# Patient Record
Sex: Male | Born: 1985 | Race: White | Hispanic: No | Marital: Married | Smoking: Never smoker
Health system: Southern US, Community
[De-identification: ages and names within clinical notes are randomized; demographics above are authoritative.]

## PROBLEM LIST (undated history)

## (undated) DIAGNOSIS — T7840XA Allergy, unspecified, initial encounter: Secondary | ICD-10-CM

## (undated) DIAGNOSIS — Z8619 Personal history of other infectious and parasitic diseases: Secondary | ICD-10-CM

## (undated) DIAGNOSIS — K219 Gastro-esophageal reflux disease without esophagitis: Secondary | ICD-10-CM

## (undated) HISTORY — PX: TONSILLECTOMY: SUR1361

## (undated) HISTORY — DX: Gastro-esophageal reflux disease without esophagitis: K21.9

## (undated) HISTORY — DX: Personal history of other infectious and parasitic diseases: Z86.19

## (undated) HISTORY — DX: Allergy, unspecified, initial encounter: T78.40XA

---

## 2018-10-16 ENCOUNTER — Encounter: Payer: Self-pay | Admitting: Family Medicine

## 2018-10-16 ENCOUNTER — Ambulatory Visit: Payer: Self-pay | Admitting: Family Medicine

## 2018-10-16 VITALS — BP 120/90 | HR 91 | Temp 98.0°F | Wt 323.0 lb

## 2018-10-16 DIAGNOSIS — I10 Essential (primary) hypertension: Secondary | ICD-10-CM | POA: Insufficient documentation

## 2018-10-16 LAB — CBC
HCT: 45.7 % (ref 39.0–52.0)
Hemoglobin: 15.6 g/dL (ref 13.0–17.0)
MCHC: 34.1 g/dL (ref 30.0–36.0)
MCV: 83.4 fl (ref 78.0–100.0)
PLATELETS: 258 10*3/uL (ref 150.0–400.0)
RBC: 5.48 Mil/uL (ref 4.22–5.81)
RDW: 13.3 % (ref 11.5–15.5)
WBC: 7.7 10*3/uL (ref 4.0–10.5)

## 2018-10-16 LAB — COMPREHENSIVE METABOLIC PANEL
ALT: 48 U/L (ref 0–53)
AST: 25 U/L (ref 0–37)
Albumin: 4.6 g/dL (ref 3.5–5.2)
Alkaline Phosphatase: 80 U/L (ref 39–117)
BUN: 12 mg/dL (ref 6–23)
CALCIUM: 9.7 mg/dL (ref 8.4–10.5)
CO2: 27 mEq/L (ref 19–32)
Chloride: 100 mEq/L (ref 96–112)
Creatinine, Ser: 1 mg/dL (ref 0.40–1.50)
GFR: 91.82 mL/min (ref >60.00)
Glucose, Bld: 90 mg/dL (ref 70–99)
Potassium: 4.5 mEq/L (ref 3.5–5.1)
Sodium: 136 mEq/L (ref 135–145)
Total Bilirubin: 0.9 mg/dL (ref 0.2–1.2)
Total Protein: 7.2 g/dL (ref 6.0–8.3)

## 2018-10-16 LAB — TSH: TSH: 0.95 u[IU]/mL (ref 0.35–4.50)

## 2018-10-16 MED ORDER — LISINOPRIL 10 MG PO TABS: 10.0000 mg | ORAL_TABLET | Freq: Every day | ORAL | 3 refills | Status: DC

## 2018-10-16 NOTE — Progress Notes (Signed)
Carlos LernerJoshua Kussman - 32 y.o. male MRN [id number]  Date of birth: 1986/06/18  Subjective Chief Complaint  Patient presents with  . Hypertension    HPI Carlos Lynn is a 32 y.o. male here today with concern of elevated blood pressure.  He is accompanied by his mother who is a Engineer, civil (consulting)nurse and has been monitoring BP at home.  Reports that readings at home have been 170/100 to as high as 220/120.   Reports that high readings were when he was mad and under stress.  Has a new baby at home as well, denies increased stress relate to this.  He had had some intermittent headache but denies vision changes, anginal symptoms or shortness of breath.  He does admit to a high salt diet.  His activity levels varies each day.    ROS:  A comprehensive ROS was completed and negative except as noted per HPI  No Known Allergies  Past Medical History:  Diagnosis Date  . Allergy   . GERD (gastroesophageal reflux disease)   . History of chickenpox     History reviewed. No pertinent surgical history.  Social History   Socioeconomic History  . Marital status: Married    Spouse name: Not on file  . Number of children: Not on file  . Years of education: Not on file  . Highest education level: Not on file  Occupational History  . Not on file  Social Needs  . Financial resource strain: Not on file  . Food insecurity:    Worry: Not on file    Inability: Not on file  . Transportation needs:    Medical: Not on file    Non-medical: Not on file  Tobacco Use  . Smoking status: Never Smoker  . Smokeless tobacco: Current User    Types: Chew  Substance and Sexual Activity  . Alcohol use: Yes    Alcohol/week: 1.0 - 2.0 standard drinks    Types: 1 - 2 Cans of beer per week    Comment: 1-2 daily  . Drug use: Never  . Sexual activity: Not on file  Lifestyle  . Physical activity:    Days per week: Not on file    Minutes per session: Not on file  . Stress: Not on file  Relationships  . Social connections:   Talks on phone: Not on file    Gets together: Not on file    Attends religious service: Not on file    Active member of club or organization: Not on file    Attends meetings of clubs or organizations: Not on file    Relationship status: Not on file  Other Topics Concern  . Not on file  Social History Narrative  . Not on file    Family History  Problem Relation Age of Onset  . Heart disease Father   . Hypertension Father     Health Maintenance  Topic Date Due  . TETANUS/TDAP  05/31/2005  . HIV Screening  10/17/2019 (Originally 05/31/2001)  . INFLUENZA VACCINE  10/22/2019 (Originally 06/13/2018)    ----------------------------------------------------------------------------------------------------------------------------------------------------------------------------------------------------------------- Physical Exam BP 120/90   Pulse 91   Temp 98 F (36.7 C) (Oral)   Wt (!) 323 lb (146.5 kg)   SpO2 98%   Physical Exam  Constitutional: He is oriented to person, place, and time. He appears well-nourished. No distress.  HENT:  Head: Normocephalic and atraumatic.  Mouth/Throat: Oropharynx is clear and moist.  Eyes: No scleral icterus.  Neck: Neck supple. No thyromegaly present.  Cardiovascular: Normal rate, regular rhythm and normal heart sounds.  Pulmonary/Chest: Effort normal and breath sounds normal.  Lymphadenopathy:    He has no cervical adenopathy.  Neurological: He is alert and oriented to person, place, and time.  Skin: Skin is warm and dry.  Psychiatric: He has a normal mood and affect. His behavior is normal.    ------------------------------------------------------------------------------------------------------------------------------------------------------------------------------------------------------------------- Assessment and Plan  Essential hypertension -BP elevated today but not nearly as high as home readings.  May be related to cuff size at home.    -Start lisinopril 10mg  -Discussed weight loss and reduction of sodium intake  -Labs ordered today.  Return in about 6 weeks (around 11/27/2018) for HTN.

## 2018-10-16 NOTE — Patient Instructions (Addendum)
Nice to meet you Start lisinopril 10mg  daily See me again in about 6 weeks Merry Christmas and Happy New Year!  Hypertension Hypertension is another name for high blood pressure. High blood pressure forces your heart to work harder to pump blood. This can cause problems over time. There are two numbers in a blood pressure reading. There is a top number (systolic) over a bottom number (diastolic). It is best to have a blood pressure below 120/80. Healthy choices can help lower your blood pressure. You may need medicine to help lower your blood pressure if:  Your blood pressure cannot be lowered with healthy choices.  Your blood pressure is higher than 130/80.  Follow these instructions at home: Eating and drinking  If directed, follow the DASH eating plan. This diet includes: ? Filling half of your plate at each meal with fruits and vegetables. ? Filling one quarter of your plate at each meal with whole grains. Whole grains include whole wheat pasta, brown rice, and whole grain bread. ? Eating or drinking low-fat dairy products, such as skim milk or low-fat yogurt. ? Filling one quarter of your plate at each meal with low-fat (lean) proteins. Low-fat proteins include fish, skinless chicken, eggs, beans, and tofu. ? Avoiding fatty meat, cured and processed meat, or chicken with skin. ? Avoiding premade or processed food.  Eat less than 1,500 mg of salt (sodium) a day.  Limit alcohol use to no more than 1 drink a day for nonpregnant women and 2 drinks a day for men. One drink equals 12 oz of beer, 5 oz of wine, or 1 oz of hard liquor. Lifestyle  Work with your doctor to stay at a healthy weight or to lose weight. Ask your doctor what the best weight is for you.  Get at least 30 minutes of exercise that causes your heart to beat faster (aerobic exercise) most days of the week. This may include walking, swimming, or biking.  Get at least 30 minutes of exercise that strengthens your  muscles (resistance exercise) at least 3 days a week. This may include lifting weights or pilates.  Do not use any products that contain nicotine or tobacco. This includes cigarettes and e-cigarettes. If you need help quitting, ask your doctor.  Check your blood pressure at home as told by your doctor.  Keep all follow-up visits as told by your doctor. This is important. Medicines  Take over-the-counter and prescription medicines only as told by your doctor. Follow directions carefully.  Do not skip doses of blood pressure medicine. The medicine does not work as well if you skip doses. Skipping doses also puts you at risk for problems.  Ask your doctor about side effects or reactions to medicines that you should watch for. Contact a doctor if:  You think you are having a reaction to the medicine you are taking.  You have headaches that keep coming back (recurring).  You feel dizzy.  You have swelling in your ankles.  You have trouble with your vision. Get help right away if:  You get a very bad headache.  You start to feel confused.  You feel weak or numb.  You feel faint.  You get very bad pain in your: ? Chest. ? Belly (abdomen).  You throw up (vomit) more than once.  You have trouble breathing. Summary  Hypertension is another name for high blood pressure.  Making healthy choices can help lower blood pressure. If your blood pressure cannot be controlled with healthy  choices, you may need to take medicine. This information is not intended to replace advice given to you by your health care provider. Make sure you discuss any questions you have with your health care provider. Document Released: 04/17/2008 Document Revised: 09/27/2016 Document Reviewed: 09/27/2016 Elsevier Interactive Patient Education  [employer].

## 2018-10-16 NOTE — Assessment & Plan Note (Signed)
-  BP elevated today but not nearly as high as home readings.  May be related to cuff size at home.  -Start lisinopril 10mg  -Discussed weight loss and reduction of sodium intake  -Labs ordered today.  Return in about 6 weeks (around 11/27/2018) for HTN.

## 2018-11-29 ENCOUNTER — Encounter: Payer: Self-pay | Admitting: Family Medicine

## 2018-11-29 ENCOUNTER — Ambulatory Visit: Payer: Self-pay | Admitting: Family Medicine

## 2018-11-29 DIAGNOSIS — I10 Essential (primary) hypertension: Secondary | ICD-10-CM

## 2018-11-29 MED ORDER — AMLODIPINE BESYLATE 5 MG PO TABS: 5.0000 mg | ORAL_TABLET | Freq: Every day | ORAL | 0 refills | Status: DC

## 2018-11-29 NOTE — Assessment & Plan Note (Addendum)
-  BP remains elevated -Unfortunately could not tolerate lisinopril and discontinued.  -Will have him try amlodipine, he will let me know if he has side effects from this -Reminded to follow low sodium diet -F/u in 6 weeks.

## 2018-11-29 NOTE — Progress Notes (Signed)
Carlos Lynn - 33 y.o. male MRN [id number]  Date of birth: 29-Apr-1986  Subjective Chief Complaint  Patient presents with  . Hypertension    has been feeling sluggish since taking lisinopril-has not been taking it for the past three days    HPI Carlos Lynn is a 33 y.o. male with history of HTN here today for follow up of HTN.   He was started on lisinopril 10mg  at last visit.  He discontinued this medication a few days ago because he felt that it was causing him to feel sluggish.  BP remains high today.  He has been working on sodium reduction.  Still not sleeping well due to getting up with infant nightly.  He has intermittent headache but denies chest pain, shortness of breath, palpitations or edema.   ROS:  A comprehensive ROS was completed and negative except as noted per HPI  No Known Allergies  Past Medical History:  Diagnosis Date  . Allergy   . GERD (gastroesophageal reflux disease)   . History of chickenpox     No past surgical history on file.  Social History   Socioeconomic History  . Marital status: Married    Spouse name: Not on file  . Number of children: Not on file  . Years of education: Not on file  . Highest education level: Not on file  Occupational History  . Not on file  Social Needs  . Financial resource strain: Not on file  . Food insecurity:    Worry: Not on file    Inability: Not on file  . Transportation needs:    Medical: Not on file    Non-medical: Not on file  Tobacco Use  . Smoking status: Never Smoker  . Smokeless tobacco: Current User    Types: Chew  Substance and Sexual Activity  . Alcohol use: Yes    Alcohol/week: 1.0 - 2.0 standard drinks    Types: 1 - 2 Cans of beer per week    Comment: 1-2 daily  . Drug use: Never  . Sexual activity: Not on file  Lifestyle  . Physical activity:    Days per week: Not on file    Minutes per session: Not on file  . Stress: Not on file  Relationships  . Social connections:    Talks on  phone: Not on file    Gets together: Not on file    Attends religious service: Not on file    Active member of club or organization: Not on file    Attends meetings of clubs or organizations: Not on file    Relationship status: Not on file  Other Topics Concern  . Not on file  Social History Narrative  . Not on file    Family History  Problem Relation Age of Onset  . Heart disease Father   . Hypertension Father     Health Maintenance  Topic Date Due  . TETANUS/TDAP  05/31/2005  . HIV Screening  10/17/2019 (Originally 05/31/2001)  . INFLUENZA VACCINE  10/22/2019 (Originally 06/13/2018)    ----------------------------------------------------------------------------------------------------------------------------------------------------------------------------------------------------------------- Physical Exam BP (!) 152/87   Pulse 80   Temp 98.2 F (36.8 C) (Oral)   Ht 6\' 1"  (1.854 m)   Wt (!) 329 lb (149.2 kg)   SpO2 98%   BMI 43.41 kg/m   Physical Exam Constitutional:      Appearance: Normal appearance. He is not ill-appearing.  HENT:     Head: Normocephalic and atraumatic.     Mouth/Throat:  Pharynx: Oropharynx is clear.  Eyes:     General: No scleral icterus. Neck:     Musculoskeletal: Neck supple.  Cardiovascular:     Rate and Rhythm: Normal rate and regular rhythm.  Pulmonary:     Effort: Pulmonary effort is normal.     Breath sounds: Normal breath sounds.  Lymphadenopathy:     Cervical: No cervical adenopathy.  Neurological:     General: No focal deficit present.     Mental Status: He is alert.  Psychiatric:        Mood and Affect: Mood normal.        Behavior: Behavior normal.     ------------------------------------------------------------------------------------------------------------------------------------------------------------------------------------------------------------------- Assessment and Plan  Essential hypertension -BP remains  elevated -Unfortunately could not tolerate lisinopril and discontinued.  -Will have him try amlodipine, he will let me know if he has side effects from this -Reminded to follow low sodium diet -F/u in 6 weeks.

## 2018-11-29 NOTE — Patient Instructions (Signed)

## 2019-01-10 ENCOUNTER — Encounter: Payer: Self-pay | Admitting: Family Medicine

## 2019-01-10 ENCOUNTER — Ambulatory Visit: Payer: Self-pay | Admitting: Family Medicine

## 2019-01-10 VITALS — BP 138/74 | HR 82 | Temp 97.9°F | Ht 73.0 in | Wt 331.0 lb

## 2019-01-10 DIAGNOSIS — M545 Low back pain, unspecified: Secondary | ICD-10-CM

## 2019-01-10 DIAGNOSIS — I10 Essential (primary) hypertension: Secondary | ICD-10-CM

## 2019-01-10 NOTE — Assessment & Plan Note (Signed)
-  BP well controlled -Continue amlodipine -F/u in 6 months.

## 2019-01-10 NOTE — Assessment & Plan Note (Signed)
-  Recommend short term NSAID and given handout on HEP/Stretches.  -F/u if not improving or if worsening

## 2019-01-10 NOTE — Progress Notes (Signed)
Carlos Lynn - 33 y.o. male MRN [id number]  Date of birth: [birth year]  Subjective Chief Complaint  Patient presents with  . Hypertension    Doing well, denies changes in his health. Taking BP medication daily    HPI Carlos Lynn is a 33 y.o. male here today for follow up of HTN.  He is currently taking amlodipine and doing well with this.  He denies chest pain,  shortness of breath, palpitations, headache or vision change.  He reports he has a sensation that his legs are warm in the evening before going to bed.  He isn't sure if this is related to medication as she has also had some mild low back pain and L hip pain.  Recently took trip to [place] and did more walking.  Has been doing some stretches with some improvement.   ROS:  A comprehensive ROS was completed and negative except as noted per HPI  No Known Allergies  Past Medical History:  Diagnosis Date  . Allergy   . GERD (gastroesophageal reflux disease)   . History of chickenpox     No past surgical history on file.  Social History   Socioeconomic History  . Marital status: Married    Spouse name: Not on file  . Number of children: Not on file  . Years of education: Not on file  . Highest education level: Not on file  Occupational History  . Not on file  Social Needs  . Financial resource strain: Not on file  . Food insecurity:    Worry: Not on file    Inability: Not on file  . Transportation needs:    Medical: Not on file    Non-medical: Not on file  Tobacco Use  . Smoking status: Never Smoker  . Smokeless tobacco: Current User    Types: Chew  Substance and Sexual Activity  . Alcohol use: Yes    Alcohol/week: 1.0 - 2.0 standard drinks    Types: 1 - 2 Cans of beer per week    Comment: 1-2 daily  . Drug use: Never  . Sexual activity: Not on file  Lifestyle  . Physical activity:    Days per week: Not on file    Minutes per session: Not on file  . Stress: Not on file  Relationships  . Social  connections:    Talks on phone: Not on file    Gets together: Not on file    Attends religious service: Not on file    Active member of club or organization: Not on file    Attends meetings of clubs or organizations: Not on file    Relationship status: Not on file  Other Topics Concern  . Not on file  Social History Narrative  . Not on file    Family History  Problem Relation Age of Onset  . Heart disease Father   . Hypertension Father     Health Maintenance  Topic Date Due  . TETANUS/TDAP  05/31/2005  . HIV Screening  10/17/2019 (Originally 05/31/2001)  . INFLUENZA VACCINE  10/22/2019 (Originally 06/13/2018)    ----------------------------------------------------------------------------------------------------------------------------------------------------------------------------------------------------------------- Physical Exam BP 138/74   Pulse 82   Temp 97.9 F (36.6 C) (Oral)   Ht 6\' 1"  (1.854 m)   Wt (!) 331 lb (150.1 kg)   SpO2 (!) 9%   BMI 43.67 kg/m   Physical Exam Constitutional:      Appearance: Normal appearance.  HENT:     Head: Normocephalic and atraumatic.  Mouth/Throat:     Mouth: Mucous membranes are moist.  Eyes:     General: No scleral icterus. Neck:     Musculoskeletal: Neck supple.  Cardiovascular:     Rate and Rhythm: Normal rate and regular rhythm.  Pulmonary:     Effort: Pulmonary effort is normal.     Breath sounds: Normal breath sounds.  Skin:    General: Skin is warm and dry.     Findings: No rash.  Neurological:     General: No focal deficit present.     Mental Status: He is alert.  Psychiatric:        Mood and Affect: Mood normal.        Behavior: Behavior normal.     ------------------------------------------------------------------------------------------------------------------------------------------------------------------------------------------------------------------- Assessment and Plan  Essential  hypertension -BP well controlled -Continue amlodipine -F/u in 6 months.   Acute left-sided low back pain without sciatica -Recommend short term NSAID and given handout on HEP/Stretches.  -F/u if not improving or if worsening

## 2019-01-10 NOTE — Patient Instructions (Signed)
Continue current medication Try stretches on handout.

## 2019-03-10 ENCOUNTER — Other Ambulatory Visit: Payer: Self-pay | Admitting: Family Medicine

## 2019-07-11 ENCOUNTER — Ambulatory Visit: Payer: Self-pay | Admitting: Family Medicine

## 2019-07-11 ENCOUNTER — Telehealth: Payer: Self-pay

## 2019-07-11 NOTE — Telephone Encounter (Signed)
Questions for Screening COVID-19  Symptom onset: n/a  Travel or Contacts:no  During this illness, did/does the patient experience any of the following symptoms? Fever >100.4F []   Yes [x]   No []   Unknown Subjective fever (felt feverish) []   Yes [x]   No []   Unknown Chills []   Yes [x]   No []   Unknown Muscle aches (myalgia) []   Yes [x]   No []   Unknown Runny nose (rhinorrhea) []   Yes [x]   No []   Unknown Sore throat []   Yes [x]   No []   Unknown Cough (new onset or worsening of chronic cough) []   Yes [x]   No []   Unknown Shortness of breath (dyspnea) []   Yes [x]   No []   Unknown Nausea or vomiting []   Yes [x]   No []   Unknown Headache []   Yes [x]   No []   Unknown Abdominal pain  []   Yes [x]   No []   Unknown Diarrhea (?3 loose/looser than normal stools/24hr period) []   Yes [x]   No []   Unknown Other, specify:  Patient risk factors: Smoker? []   Current []   Former []   Never If male, currently pregnant? []   Yes []   No  Patient Active Problem List   Diagnosis Date Noted  . Acute left-sided low back pain without sciatica 01/10/2019  . Essential hypertension 10/16/2018    Plan:  []   High risk for COVID-19 with red flags go to ED (with CP, SOB, weak/lightheaded, or fever > 101.5). Call ahead.  []   High risk for COVID-19 but stable. Inform provider and coordinate time for [facility] visit.   []   No red flags but URI signs or symptoms okay for [facility] visit.

## 2019-07-14 ENCOUNTER — Other Ambulatory Visit: Payer: Self-pay

## 2019-07-14 ENCOUNTER — Encounter: Payer: Self-pay | Admitting: Family Medicine

## 2019-07-14 ENCOUNTER — Ambulatory Visit: Payer: Self-pay | Admitting: Family Medicine

## 2019-07-14 DIAGNOSIS — I10 Essential (primary) hypertension: Secondary | ICD-10-CM

## 2019-07-14 DIAGNOSIS — R519 Headache, unspecified: Secondary | ICD-10-CM | POA: Insufficient documentation

## 2019-07-14 DIAGNOSIS — R51 Headache: Secondary | ICD-10-CM

## 2019-07-14 NOTE — Assessment & Plan Note (Signed)
-  Recurrent headaches, thought to be BP related but continues to have despite BP control. Family history of brain tumor.  MRI ordered.

## 2019-07-14 NOTE — Progress Notes (Signed)
Carlos LernerJoshua Lynn - 33 y.o. male MRN [id number]  Date of birth: 01/18/1986  Subjective Chief Complaint  Patient presents with  . Follow-up    HTN/ pt complains of light headedness and headaches/ pt states sluggish/ wants to discuss family history of same symptoms and had brain tumor /pt been out of meds for 2 days- no light headedness since off medication     HPI Carlos LernerJoshua Lynn is a 33 y.o. male with history of HTN here today for follow up.  He is prescribed amlodipine.  Reports feeling light headed while taking medication.  He has been out of medication x2 days and reports that he has felt much better being off medication.  His BP is normal today.  He tells me that he has really cut back on salt and caffeine intake.  He does tell me that he continues to have frequent headaches.  He has a family history of brain cancer in his uncle who had similar symptoms.  He denies chest pain, shortness of breath, palpitations, nausea or vomiting.   ROS:  A comprehensive ROS was completed and negative except as noted per HPI  No Known Allergies  Past Medical History:  Diagnosis Date  . Allergy   . GERD (gastroesophageal reflux disease)   . History of chickenpox     No past surgical history on file.  Social History   Socioeconomic History  . Marital status: Married    Spouse name: Not on file  . Number of children: Not on file  . Years of education: Not on file  . Highest education level: Not on file  Occupational History  . Not on file  Social Needs  . Financial resource strain: Not on file  . Food insecurity    Worry: Not on file    Inability: Not on file  . Transportation needs    Medical: Not on file    Non-medical: Not on file  Tobacco Use  . Smoking status: Never Smoker  . Smokeless tobacco: Current User    Types: Chew  Substance and Sexual Activity  . Alcohol use: Yes    Alcohol/week: 1.0 - 2.0 standard drinks    Types: 1 - 2 Cans of beer per week    Comment: 1-2 daily  .  Drug use: Never  . Sexual activity: Not on file  Lifestyle  . Physical activity    Days per week: Not on file    Minutes per session: Not on file  . Stress: Not on file  Relationships  . Social Musicianconnections    Talks on phone: Not on file    Gets together: Not on file    Attends religious service: Not on file    Active member of club or organization: Not on file    Attends meetings of clubs or organizations: Not on file    Relationship status: Not on file  Other Topics Concern  . Not on file  Social History Narrative  . Not on file    Family History  Problem Relation Age of Onset  . Heart disease Father   . Hypertension Father     Health Maintenance  Topic Date Due  . TETANUS/TDAP  05/31/2005  . INFLUENZA VACCINE  06/14/2019  . HIV Screening  10/17/2019 (Originally 05/31/2001)    ----------------------------------------------------------------------------------------------------------------------------------------------------------------------------------------------------------------- Physical Exam BP 130/80   Pulse 79   Temp 97.9 F (36.6 C) (Oral)   Ht 6\' 1"  (1.854 m)   Wt (!) 327 lb (148.3 kg)  SpO2 96%   BMI 43.14 kg/m   Physical Exam Constitutional:      Appearance: Normal appearance.  HENT:     Head: Normocephalic and atraumatic.     Mouth/Throat:     Mouth: Mucous membranes are moist.  Eyes:     General: No scleral icterus. Neck:     Musculoskeletal: Neck supple.  Cardiovascular:     Rate and Rhythm: Normal rate and regular rhythm.  Pulmonary:     Effort: Pulmonary effort is normal.     Breath sounds: Normal breath sounds.  Skin:    General: Skin is warm and dry.  Neurological:     General: No focal deficit present.     Mental Status: He is alert and oriented to person, place, and time.     Cranial Nerves: No cranial nerve deficit.     Motor: No weakness.     Coordination: Coordination normal.     Gait: Gait normal.  Psychiatric:         Mood and Affect: Mood normal.        Behavior: Behavior normal.     ------------------------------------------------------------------------------------------------------------------------------------------------------------------------------------------------------------------- Assessment and Plan  Essential hypertension -BP is well controlled off medication and he was having side effects while taking.  Will hold off on restarting at this time and have him continue to monitor at home. Discussed with him that dietary changes likely helped make the difference in his BP.   Intractable headache -Recurrent headaches, thought to be BP related but continues to have despite BP control. Family history of brain tumor.  MRI ordered.

## 2019-07-14 NOTE — Patient Instructions (Signed)
Lets hold off on restarting BP medication for now as your pressures are normal today.  Continue to follow a low salt diet and reduce caffeine.  I have ordered an MRI and you will be contacted to set this up.

## 2019-07-14 NOTE — Assessment & Plan Note (Signed)
-  BP is well controlled off medication and he was having side effects while taking.  Will hold off on restarting at this time and have him continue to monitor at home. Discussed with him that dietary changes likely helped make the difference in his BP.

## 2019-08-08 ENCOUNTER — Other Ambulatory Visit: Payer: Self-pay | Admitting: Family Medicine

## 2019-08-08 ENCOUNTER — Ambulatory Visit
Admission: RE | Admit: 2019-08-08 | Discharge: 2019-08-08 | Disposition: A | Discharge status: Home / Self Care | Payer: No Typology Code available for payment source | Source: Ambulatory Visit | Attending: Family Medicine | Admitting: Family Medicine

## 2019-08-08 ENCOUNTER — Ambulatory Visit
Admission: RE | Admit: 2019-08-08 | Discharge: 2019-08-08 | Disposition: A | Discharge status: Home / Self Care | Payer: Self-pay | Source: Ambulatory Visit | Attending: Family Medicine | Admitting: Family Medicine

## 2019-08-08 ENCOUNTER — Other Ambulatory Visit: Payer: Self-pay

## 2019-08-08 DIAGNOSIS — R519 Headache, unspecified: Secondary | ICD-10-CM

## 2019-08-11 NOTE — Progress Notes (Signed)
Please let patient know that MRI is normal.

## 2019-10-11 ENCOUNTER — Emergency Department (HOSPITAL_BASED_OUTPATIENT_CLINIC_OR_DEPARTMENT_OTHER)
Admission: EM | Admit: 2019-10-11 | Discharge: 2019-10-11 | Disposition: A | Discharge status: Home / Self Care | Payer: No Typology Code available for payment source | Attending: Emergency Medicine | Admitting: Emergency Medicine

## 2019-10-11 ENCOUNTER — Other Ambulatory Visit: Payer: Self-pay

## 2019-10-11 ENCOUNTER — Emergency Department (HOSPITAL_BASED_OUTPATIENT_CLINIC_OR_DEPARTMENT_OTHER): Payer: No Typology Code available for payment source

## 2019-10-11 ENCOUNTER — Encounter (HOSPITAL_BASED_OUTPATIENT_CLINIC_OR_DEPARTMENT_OTHER): Payer: Self-pay | Admitting: *Deleted

## 2019-10-11 DIAGNOSIS — S0990XA Unspecified injury of head, initial encounter: Secondary | ICD-10-CM | POA: Insufficient documentation

## 2019-10-11 DIAGNOSIS — W5512XA Struck by horse, initial encounter: Secondary | ICD-10-CM | POA: Insufficient documentation

## 2019-10-11 DIAGNOSIS — I10 Essential (primary) hypertension: Secondary | ICD-10-CM | POA: Insufficient documentation

## 2019-10-11 DIAGNOSIS — Y9389 Activity, other specified: Secondary | ICD-10-CM | POA: Insufficient documentation

## 2019-10-11 DIAGNOSIS — Y999 Unspecified external cause status: Secondary | ICD-10-CM | POA: Insufficient documentation

## 2019-10-11 DIAGNOSIS — Z23 Encounter for immunization: Secondary | ICD-10-CM | POA: Insufficient documentation

## 2019-10-11 DIAGNOSIS — Y9289 Other specified places as the place of occurrence of the external cause: Secondary | ICD-10-CM | POA: Insufficient documentation

## 2019-10-11 MED ORDER — TETANUS-DIPHTH-ACELL PERTUSSIS 5-2.5-18.5 LF-MCG/0.5 IM SUSP
0.5000 mL | Freq: Once | INTRAMUSCULAR | Status: AC
  Administered 2019-10-11: 0.5 mL via INTRAMUSCULAR
  Filled 2019-10-11: qty 0.5

## 2019-10-11 NOTE — ED Provider Notes (Signed)
[facility] DEPART[facility] Provider Note   CSN: [id number] Arrival date & time: 10/11/19  1946     History   Chief Complaint Chief Complaint  Patient presents with  . Head Injury    HPI Carlos Lynn is a 33 y.o. male with history of hypertension who presents with left-sided jaw and head pain as well as left wrist swelling after being kicked by a horse.  He did not lose consciousness, but has felt nauseated.  He has trouble opening his jaw wide.  He reports very mild pain in his left wrist with associated swelling.  He also reports some bruising and soreness in his right thigh.  Patient has been ambulatory and worked the rest of his day before he came today.  His tetanus is not up-to-date.  He does note that he felt like he bit his lip when it happened, but that was controlled quickly.     HPI  Past Medical History:  Diagnosis Date  . Allergy   . GERD (gastroesophageal reflux disease)   . History of chickenpox     Patient Active Problem List   Diagnosis Date Noted  . Intractable headache 07/14/2019  . Acute left-sided low back pain without sciatica 01/10/2019  . Essential hypertension 10/16/2018    Past Surgical History:  Procedure Laterality Date  . TONSILLECTOMY          Home Medications    Prior to Admission medications   Medication Sig Start Date End Date Taking? Authorizing Provider  amLODipine (NORVASC) 5 MG tablet TAKE 1 TABLET BY MOUTH EVERY DAY 03/10/19   Luetta Nutting, DO    Family History Family History  Problem Relation Age of Onset  . Heart disease Father   . Hypertension Father     Social History Social History   Tobacco Use  . Smoking status: Never Smoker  . Smokeless tobacco: Current User    Types: Chew  Substance Use Topics  . Alcohol use: Yes    Alcohol/week: 1.0 - 2.0 standard drinks    Types: 1 - 2 Cans of beer per week    Comment: 1-2 daily  . Drug use: Never     Allergies   Patient has no known  allergies.   Review of Systems Review of Systems  Gastrointestinal: Positive for nausea.  Musculoskeletal: Positive for myalgias. Negative for arthralgias.  Neurological: Positive for headaches. Negative for syncope.     Physical Exam Updated Vital Signs BP (!) 154/89 (BP Location: Left Arm)   Pulse 87   Temp 98.6 F (37 C) (Oral)   Resp 16   Ht 5\' 9"  (1.753 m)   Wt (!) 145.2 kg   SpO2 100%   BMI 47.26 kg/m   Physical Exam Vitals signs and nursing note reviewed.  Constitutional:      General: He is not in acute distress.    Appearance: He is well-developed. He is not diaphoretic.  HENT:     Head: Normocephalic and atraumatic.     Jaw: Tenderness, swelling and pain on movement present. No trismus.      Comments: Tenderness over the left jaw, possible step-off palpated; tenderness over the left temple; patient is able to open jaw about 2 fingers wide    Right Ear: Tympanic membrane normal. No hemotympanum.     Left Ear: Tympanic membrane normal. No hemotympanum.     Mouth/Throat:     Pharynx: No oropharyngeal exudate.  Eyes:     General: No scleral icterus.  Right eye: No discharge.        Left eye: No discharge.     Conjunctiva/sclera: Conjunctivae normal.     Pupils: Pupils are equal, round, and reactive to light.  Neck:     Musculoskeletal: Normal range of motion and neck supple.     Thyroid: No thyromegaly.  Cardiovascular:     Rate and Rhythm: Normal rate and regular rhythm.     Heart sounds: Normal heart sounds. No murmur. No friction rub. No gallop.   Pulmonary:     Effort: Pulmonary effort is normal. No respiratory distress.     Breath sounds: Normal breath sounds. No stridor. No wheezing or rales.  Abdominal:     General: Bowel sounds are normal. There is no distension.     Palpations: Abdomen is soft.     Tenderness: There is no abdominal tenderness. There is no guarding or rebound.  Lymphadenopathy:     Cervical: No cervical adenopathy.  Skin:     General: Skin is warm and dry.     Coloration: Skin is not pale.     Findings: No rash.  Neurological:     Mental Status: He is alert.     Coordination: Coordination normal.      ED Treatments / Results  Labs (all labs ordered are listed, but only abnormal results are displayed) Labs Reviewed - No data to display  EKG None  Radiology Ct Head Wo Contrast  Result Date: 10/11/2019 CLINICAL DATA:  Kicked by horse EXAM: CT HEAD WITHOUT CONTRAST CT MAXILLOFACIAL WITHOUT CONTRAST TECHNIQUE: Multidetector CT imaging of the head and maxillofacial structures were performed using the standard protocol without intravenous contrast. Multiplanar CT image reconstructions of the maxillofacial structures were also generated. COMPARISON:  None. FINDINGS: CT HEAD FINDINGS Brain: There is no mass, hemorrhage or extra-axial collection. The size and configuration of the ventricles and extra-axial CSF spaces are normal. The brain parenchyma is normal, without evidence of acute or chronic infarction. Vascular: No hyperdense vessel or unexpected vascular calcification. Skull: The visualized skull base, calvarium and extracranial soft tissues are normal. CT MAXILLOFACIAL FINDINGS Osseous: --Complex facial fracture types: No LeFort, zygomaticomaxillary complex or nasoorbitoethmoidal fracture. --Simple fracture types: None. --Mandible, hard palate and teeth: No acute abnormality. Orbits: The globes are intact. Normal appearance of the intra- and extraconal fat. Symmetric extraocular muscles. Sinuses: Mild bilateral maxillary sinus mucosal thickening. Soft tissues: Normal visualized extracranial soft tissues. IMPRESSION: 1. Normal head CT. 2. No facial or skull fracture. Electronically Signed   By: Deatra Robinson M.D.   On: 10/11/2019 22:42   Ct Maxillofacial Wo Contrast  Result Date: 10/11/2019 CLINICAL DATA:  Kicked by horse EXAM: CT HEAD WITHOUT CONTRAST CT MAXILLOFACIAL WITHOUT CONTRAST TECHNIQUE: Multidetector  CT imaging of the head and maxillofacial structures were performed using the standard protocol without intravenous contrast. Multiplanar CT image reconstructions of the maxillofacial structures were also generated. COMPARISON:  None. FINDINGS: CT HEAD FINDINGS Brain: There is no mass, hemorrhage or extra-axial collection. The size and configuration of the ventricles and extra-axial CSF spaces are normal. The brain parenchyma is normal, without evidence of acute or chronic infarction. Vascular: No hyperdense vessel or unexpected vascular calcification. Skull: The visualized skull base, calvarium and extracranial soft tissues are normal. CT MAXILLOFACIAL FINDINGS Osseous: --Complex facial fracture types: No LeFort, zygomaticomaxillary complex or nasoorbitoethmoidal fracture. --Simple fracture types: None. --Mandible, hard palate and teeth: No acute abnormality. Orbits: The globes are intact. Normal appearance of the intra- and extraconal fat. Symmetric extraocular  muscles. Sinuses: Mild bilateral maxillary sinus mucosal thickening. Soft tissues: Normal visualized extracranial soft tissues. IMPRESSION: 1. Normal head CT. 2. No facial or skull fracture. Electronically Signed   By: Deatra RobinsonKevin  Herman M.D.   On: 10/11/2019 22:42    Procedures Procedures (including critical care time)  Medications Ordered in ED Medications  Tdap (BOOSTRIX) injection 0.5 mL (0.5 mLs Intramuscular Given 10/11/19 2236)     Initial Impression / Assessment and Plan / ED Course  I have reviewed the triage vital signs and the nursing notes.  Pertinent labs & imaging results that were available during my care of the patient were reviewed by me and considered in my medical decision making (see chart for details).       Patient presenting with left-sided jaw and head pain after being kicked by a horse.  Head CT and maxillofacial are negative.  Suspect contusion.  Initially left wrist x-ray recommended due to swelling, but patient  declines.  I feel this is reasonable considering no bony tenderness, but moderate swelling.  Tetanus updated.  Possible concussion discussed, precautions discussed.  Ice and rest discussed.  Follow-up to PCP as needed.  Return precautions discussed.  Patient vitals stable throughout ED course and discharged in satisfactory condition.  Final Clinical Impressions(s) / ED Diagnoses   Final diagnoses:  Minor head injury, initial encounter    ED Discharge Orders    None       Emi HolesLaw, Maxon Kresse M, PA-C 10/11/19 2306    Rolan BuccoBelfi, Melanie, MD 10/11/19 [phone]

## 2019-10-11 NOTE — Discharge Instructions (Signed)
Your imaging was reassuring today, however this possibly had a mild concussion.  Make sure you get plenty of rest and stay well-hydrated.  Try to avoid looking at screens such as your phone, computers, tablets, or reading.  Alternate with ibuprofen and Tylenol as prescribed over-the-counter, as needed for your pain and headache.  Use ice 3-4 times daily alternating 20 minutes on, 20 minutes off.  Please follow-up with your doctor if your symptoms are not improving.  Please return to the emergency department if you develop any new or worsening symptoms.

## 2019-10-11 NOTE — ED Triage Notes (Signed)
Pt reports he was working with a horse which kicked him in the right thigh, left wrist, and head. Pt is not sure if he was directly kicked or if something else hit him in the head. Pt cao x 4, states he finished working before coming to ED. Denies LOC. C/o nausea but didn't throw up. Ambulated to triage with steady gait

## 2019-10-11 NOTE — ED Notes (Signed)
Patient refused wrist xray.  Reports he thinks its okay and would rather not have to pay for the xray.  Provider made aware.

## 2020-07-14 ENCOUNTER — Telehealth: Payer: Self-pay | Admitting: General Practice

## 2020-07-14 NOTE — Telephone Encounter (Addendum)
Patient called and state that he was feeling light headed and dizziness. Transferred patient to the triage nurse for further evaluation.

## 2020-07-15 ENCOUNTER — Telehealth: Payer: Self-pay | Admitting: General Practice

## 2020-07-15 NOTE — Telephone Encounter (Signed)
Patient spoke to the triage nurse regarding dizziness and lightheadedness, and he's also needing medication refills. Patient is requesting to see Dr. Doreene Burke. Called patient to schedule Acute visit and a TOC, but no answer. Left message to give the office a call back to schedule.

## 2020-10-05 ENCOUNTER — Ambulatory Visit (HOSPITAL_COMMUNITY)
Admission: RE | Admit: 2020-10-05 | Discharge: 2020-10-05 | Disposition: A | Discharge status: Home / Self Care | Payer: HRSA Program | Source: Ambulatory Visit | Attending: Pulmonary Disease | Admitting: Pulmonary Disease

## 2020-10-05 ENCOUNTER — Other Ambulatory Visit: Payer: Self-pay | Admitting: Oncology

## 2020-10-05 DIAGNOSIS — U071 COVID-19: Secondary | ICD-10-CM | POA: Diagnosis present

## 2020-10-05 DIAGNOSIS — Z6825 Body mass index (BMI) 25.0-25.9, adult: Secondary | ICD-10-CM | POA: Insufficient documentation

## 2020-10-05 MED ORDER — SOTROVIMAB 500 MG/8ML IV SOLN
500.0000 mg | Freq: Once | INTRAVENOUS | Status: AC
  Administered 2020-10-05: 500 mg via INTRAVENOUS

## 2020-10-05 MED ORDER — DIPHENHYDRAMINE HCL 50 MG/ML IJ SOLN: 50.0000 mg | Freq: Once | INTRAMUSCULAR | Status: DC | PRN

## 2020-10-05 MED ORDER — EPINEPHRINE 0.3 MG/0.3ML IJ SOAJ: 0.3000 mg | Freq: Once | INTRAMUSCULAR | Status: DC | PRN

## 2020-10-05 MED ORDER — METHYLPREDNISOLONE SODIUM SUCC 125 MG IJ SOLR: 125.0000 mg | Freq: Once | INTRAMUSCULAR | Status: DC | PRN

## 2020-10-05 MED ORDER — ALBUTEROL SULFATE HFA 108 (90 BASE) MCG/ACT IN AERS: 2.0000 | INHALATION_SPRAY | Freq: Once | RESPIRATORY_TRACT | Status: DC | PRN

## 2020-10-05 MED ORDER — SODIUM CHLORIDE 0.9 % IV SOLN: INTRAVENOUS | Status: DC | PRN

## 2020-10-05 MED ORDER — FAMOTIDINE IN NACL 20-0.9 MG/50ML-% IV SOLN: 20.0000 mg | Freq: Once | INTRAVENOUS | Status: DC | PRN

## 2020-10-05 NOTE — Progress Notes (Signed)
Patient reviewed Fact Sheet for Patients, Parents, and Caregivers for Emergency Use Authorization (EUA) of Sotrovimab for the Treatment of Coronavirus. Patient also reviewed and is agreeable to the estimated cost of treatment. Patient is agreeable to proceed.   

## 2020-10-05 NOTE — Progress Notes (Signed)
I connected by phone with  Carlos Lynn to discuss the potential use of an new treatment for mild to moderate COVID-19 viral infection in non-hospitalized patients.   This patient is a age/sex that meets the FDA criteria for Emergency Use Authorization of casirivimab\imdevimab.  Has a (+) direct SARS-CoV-2 viral test result 1. Has mild or moderate COVID-19  2. Is ? 34 years of age and weighs ? 40 kg 3. Is NOT hospitalized due to COVID-19 4. Is NOT requiring oxygen therapy or requiring an increase in baseline oxygen flow rate due to COVID-19 5. Is within 10 days of symptom onset 6. Has at least one of the high risk factor(s) for progression to severe COVID-19 and/or hospitalization as defined in EUA. Specific high risk criteria : Past Medical History:  Diagnosis Date  . Allergy   . GERD (gastroesophageal reflux disease)   . History of chickenpox   ?  ?    Symptom onset  09/29/2020   I have spoken and communicated the following to the patient or parent/caregiver:   1. FDA has authorized the emergency use of casirivimab\imdevimab for the treatment of mild to moderate COVID-19 in adults and pediatric patients with positive results of direct SARS-CoV-2 viral testing who are 88 years of age and older weighing at least 40 kg, and who are at high risk for progressing to severe COVID-19 and/or hospitalization.   2. The significant known and potential risks and benefits of casirivimab\imdevimab, and the extent to which such potential risks and benefits are unknown.   3. Information on available alternative treatments and the risks and benefits of those alternatives, including clinical trials.   4. Patients treated with casirivimab\imdevimab should continue to self-isolate and use infection control measures (e.g., wear mask, isolate, social distance, avoid sharing personal items, clean and disinfect "high touch" surfaces, and frequent handwashing) according to CDC guidelines.    5. The patient or  parent/caregiver has the option to accept or refuse casirivimab\imdevimab .   After reviewing this information with the patient, The patient agreed to proceed with receiving casirivimab\imdevimab infusion and will be provided a copy of the Fact sheet prior to receiving the infusion.Mignon Pine, AGNP-C [phone] (Infusion Center Hotline)

## 2020-10-05 NOTE — Discharge Instructions (Signed)

## 2020-10-05 NOTE — Progress Notes (Signed)
Diagnosis: COVID-19  Physician: Dr. Patrick Wright  Procedure: [facility] Med: Sotrovimab infusion - Provided patient with sotrovimab fact sheet for patients, parents, and caregivers prior to infusion.   Complications: No immediate complications noted  Discharge: Discharged home  If after the infusion you have any questions or concerns please call the Advanced Practice Provider at [phone] 

## 2021-07-06 IMAGING — CT CT MAXILLOFACIAL W/O CM
3 series · 15 of 47 positions shown, 18 images · non-contrast
Comparison: None.

CLINICAL DATA: Kicked by horse

EXAM:
CT HEAD WITHOUT CONTRAST
CT MAXILLOFACIAL WITHOUT CONTRAST
TECHNIQUE: Multidetector CT imaging of the head and maxillofacial structures
were performed using the standard protocol without intravenous
contrast. Multiplanar CT image reconstructions of the maxillofacial
structures were also generated.

[Series 2: max soft · axial · 0.42mm/px · z∈[+926,+1078]mm · 9 of 90 slices shown, 12 images]
[im 7/90  brain]
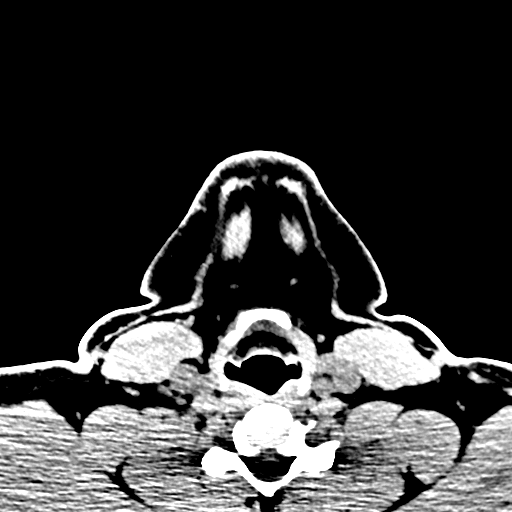
[im 7/90  bone]
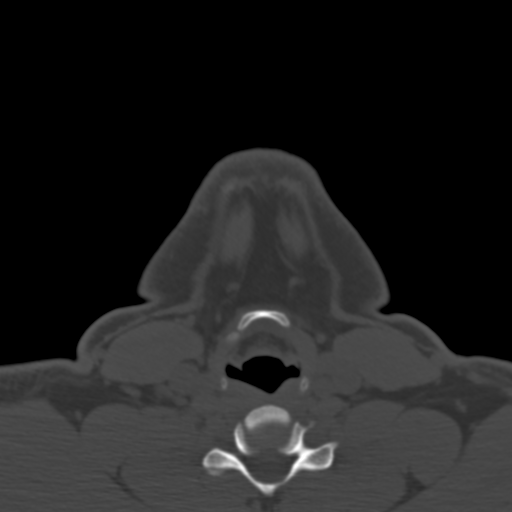
[im 16/90  bone]
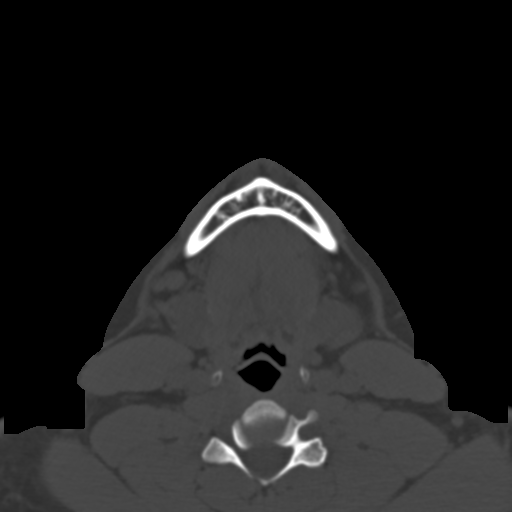
[im 25/90  bone]
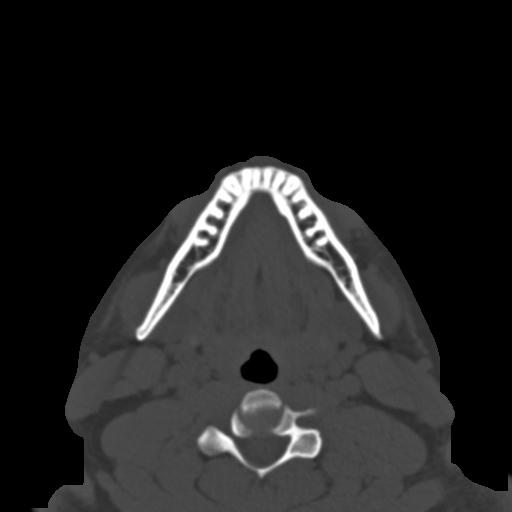
[im 34/90  bone]
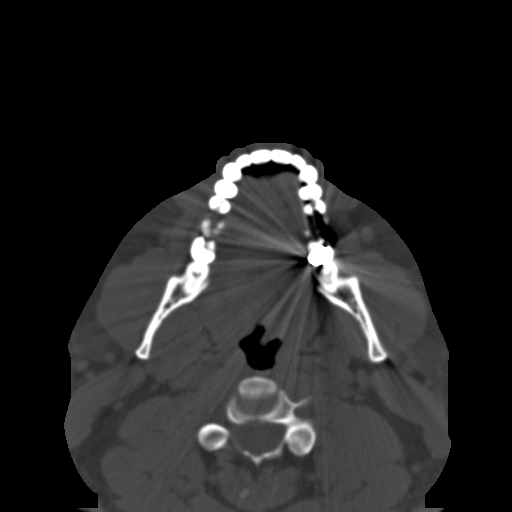
[im 47/90  brain]
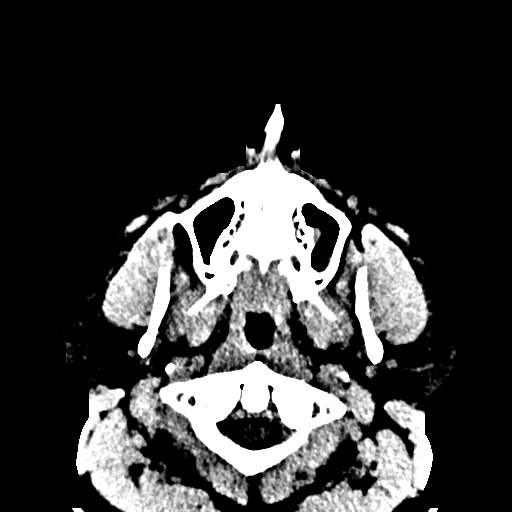
[im 47/90  bone]
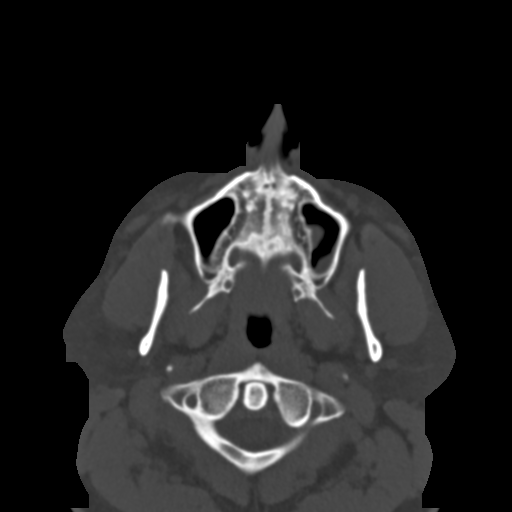
[im 56/90  bone]
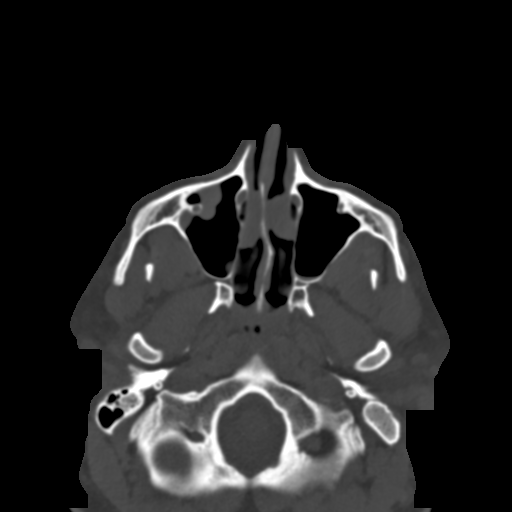
[im 65/90  bone]
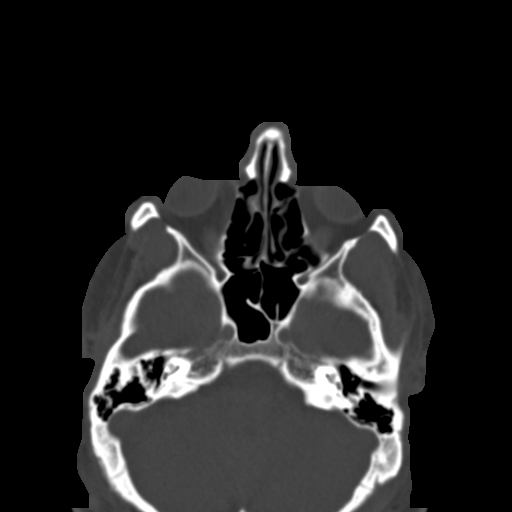
[im 74/90  bone]
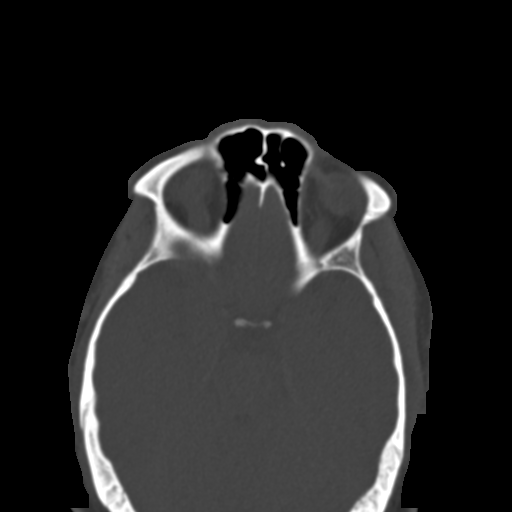
[im 83/90  brain]
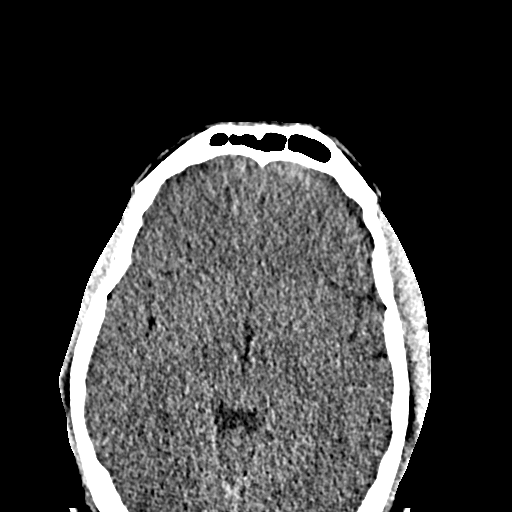
[im 83/90  bone]
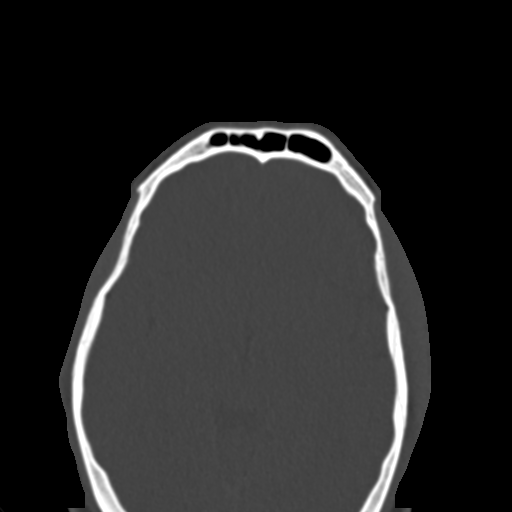

[Series 4: coronal soft · coronal · 0.38mm/px · 3 of 86 slices shown]
[im 29/86  bone]
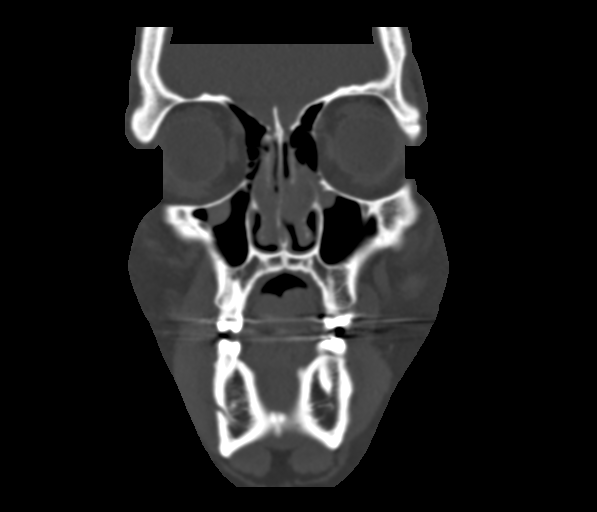
[im 38/86  bone]
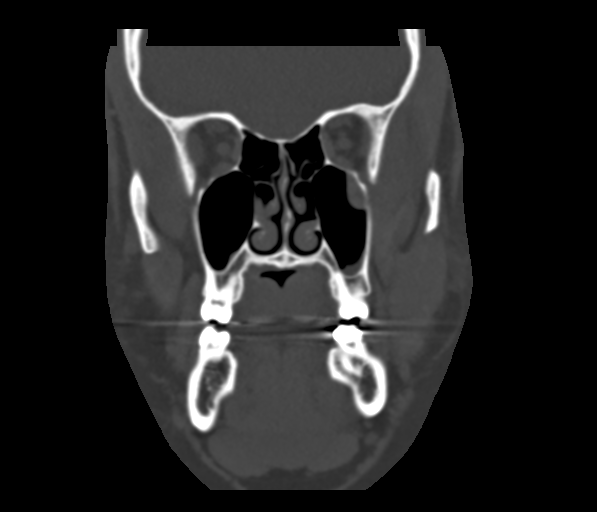
[im 48/86  bone]
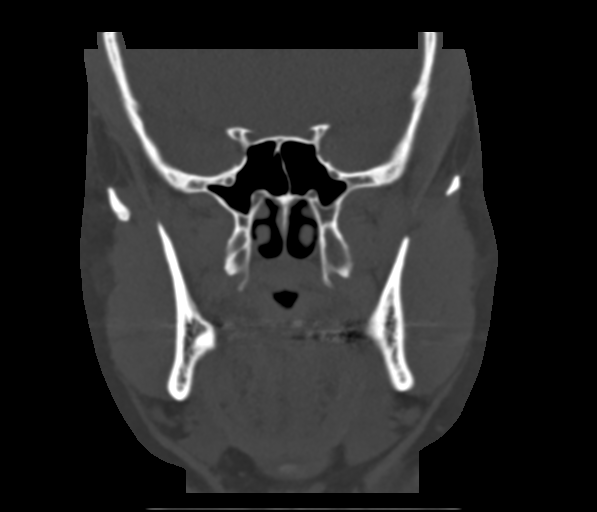

[Series 5: sagittal soft · sagittal · 0.37mm/px · 3 of 98 slices shown]
[im 33/98  bone]
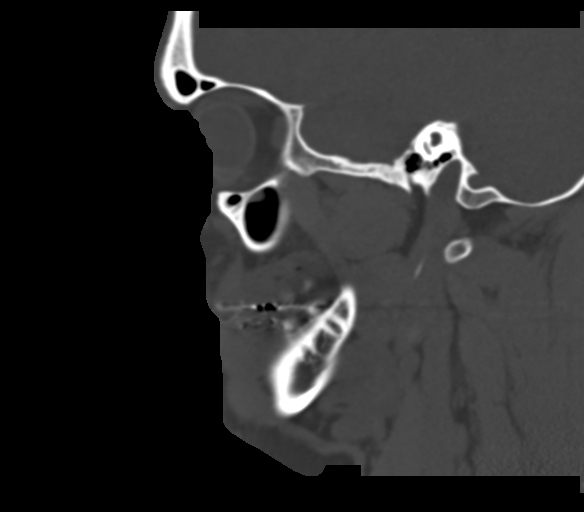
[im 49/98  bone]
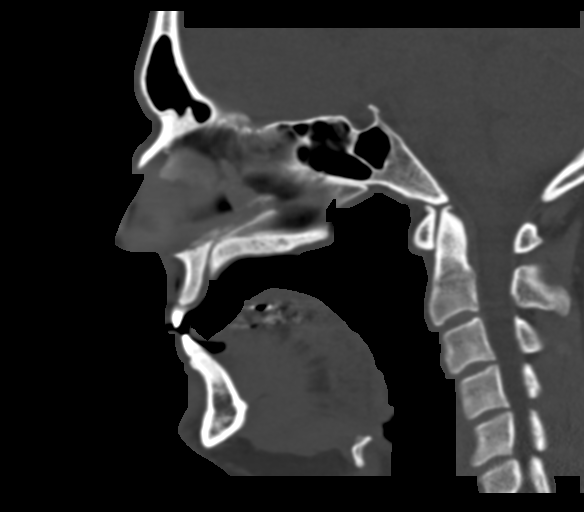
[im 65/98  bone]
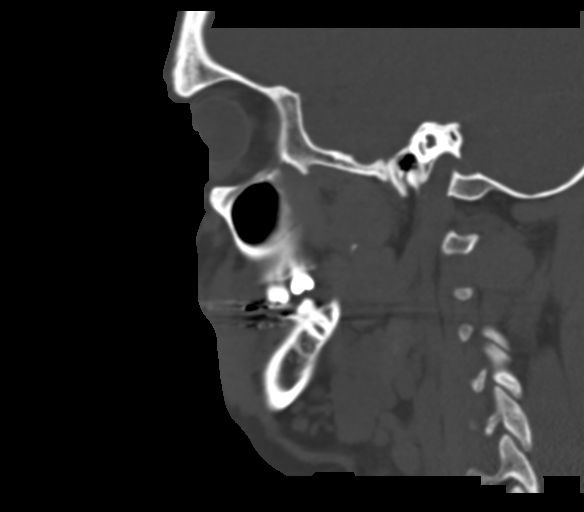

[15 of 47 positions shown; findings below may reference images not displayed]

FINDINGS: CT HEAD FINDINGS

Brain: There is no mass, hemorrhage or extra-axial collection. The
size and configuration of the ventricles and extra-axial CSF spaces
are normal. The brain parenchyma is normal, without evidence of
acute or chronic infarction.

Vascular: No hyperdense vessel or unexpected vascular calcification.

Skull: The visualized skull base, calvarium and extracranial soft
tissues are normal.

CT MAXILLOFACIAL FINDINGS

Osseous:

--Complex facial fracture types: No LeFort, zygomaticomaxillary
complex or nasoorbitoethmoidal fracture.

--Simple fracture types: None.

--Mandible, hard palate and teeth: No acute abnormality.

Orbits: The globes are intact. Normal appearance of the intra- and
extraconal fat. Symmetric extraocular muscles.

Sinuses: Mild bilateral maxillary sinus mucosal thickening.

Soft tissues: Normal visualized extracranial soft tissues.
IMPRESSION: 1. Normal head CT.
2. No facial or skull fracture.

## 2023-12-18 ENCOUNTER — Ambulatory Visit: Payer: No Typology Code available for payment source | Admitting: Podiatry

## 2023-12-20 ENCOUNTER — Ambulatory Visit: Payer: No Typology Code available for payment source | Admitting: Podiatry
